# Patient Record
Sex: Male | Born: 1985 | Race: White | Hispanic: No | Marital: Single | Smoking: Current every day smoker
Health system: Southern US, Community
[De-identification: ages and names within clinical notes are randomized; demographics above are authoritative.]

## PROBLEM LIST (undated history)

## (undated) DIAGNOSIS — I10 Essential (primary) hypertension: Secondary | ICD-10-CM

---
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
## 2011-03-10 ENCOUNTER — Encounter (HOSPITAL_COMMUNITY): Payer: Self-pay | Admitting: Emergency Medicine

## 2011-03-10 ENCOUNTER — Emergency Department (HOSPITAL_COMMUNITY)
Admission: EM | Admit: 2011-03-10 | Discharge: 2011-03-10 | Disposition: A | Discharge status: Home / Self Care | Payer: BC Managed Care – PPO | Attending: Emergency Medicine | Admitting: Emergency Medicine

## 2011-03-10 DIAGNOSIS — K0889 Other specified disorders of teeth and supporting structures: Secondary | ICD-10-CM

## 2011-03-10 DIAGNOSIS — K089 Disorder of teeth and supporting structures, unspecified: Secondary | ICD-10-CM | POA: Insufficient documentation

## 2011-03-10 MED ORDER — PENICILLIN V POTASSIUM 500 MG PO TABS: 500.0000 mg | ORAL_TABLET | Freq: Four times a day (QID) | ORAL | Status: AC

## 2011-03-10 MED ORDER — OXYCODONE-ACETAMINOPHEN 5-325 MG PO TABS: 1.0000 | ORAL_TABLET | Freq: Four times a day (QID) | ORAL | Status: AC | PRN

## 2011-03-10 NOTE — Discharge Instructions (Signed)
Dental Pain  A tooth ache may be caused by cavities (tooth decay). Cavities expose the nerve of the tooth to air and hot or cold temperatures. It may come from an infection or abscess (also called a boil or furuncle) around your tooth. It is also often caused by dental caries (tooth decay). This causes the pain you are having.  DIAGNOSIS   Your caregiver can diagnose this problem by exam.  TREATMENT   · If caused by an infection, it may be treated with medications which kill germs (antibiotics) and pain medications as prescribed by your caregiver. Take medications as directed.  · Only take over-the-counter or prescription medicines for pain, discomfort, or fever as directed by your caregiver.  · Whether the tooth ache today is caused by infection or dental disease, you should see your dentist as soon as possible for further care.  SEEK MEDICAL CARE IF:  The exam and treatment you received today has been provided on an emergency basis only. This is not a substitute for complete medical or dental care. If your problem worsens or new problems (symptoms) appear, and you are unable to meet with your dentist, call or return to this location.  SEEK IMMEDIATE MEDICAL CARE IF:   · You have a fever.  · You develop redness and swelling of your face, jaw, or neck.  · You are unable to open your mouth.  · You have severe pain uncontrolled by pain medicine.  MAKE SURE YOU:   · Understand these instructions.  · Will watch your condition.  · Will get help right away if you are not doing well or get worse.  Document Released: 12/20/2004 Document Revised: 12/09/2010 Document Reviewed: 08/08/2007  [employer]® Patient Information ©2012 [employer].

## 2011-03-10 NOTE — ED Notes (Signed)
PT. REPORTS RIGHT AND LEFT UPPER MOLAR PAIN FOR 2 WEEKS.

## 2011-03-10 NOTE — ED Provider Notes (Signed)
History     CSN: [id number]  Arrival date & time 03/10/11  2694   First MD Initiated Contact with Patient 03/10/11 [phone]      Chief Complaint  Patient presents with  . Dental Pain    (Consider location/radiation/quality/duration/timing/severity/associated sxs/prior treatment) Patient is a 26 y.o. male presenting with tooth pain. The history is provided by the patient.  Dental PainThe primary symptoms include mouth pain. Primary symptoms do not include sore throat.  Additional symptoms do not include: trouble swallowing and drooling.   patient has had dental pain for last 2 weeks. He states he has 2 wisdom teeth on the top that are impacted. He has a Education officer, community but has not been able to get in to get them removed yet. They appear to be broken off. No new trauma. No fevers. The patient is worse with eating.  History reviewed. No pertinent past medical history.  History reviewed. No pertinent past surgical history.  No family history on file.  History  Substance Use Topics  . Smoking status: Never Smoker   . Smokeless tobacco: Not on file  . Alcohol Use: No      Review of Systems  HENT: Positive for dental problem. Negative for sore throat, drooling, mouth sores, trouble swallowing and voice change.   Eyes: Negative for pain.    Allergies  Review of patient's allergies indicates not on file.  Home Medications   Current Outpatient Rx  Name Route Sig Dispense Refill  . OXYCODONE-ACETAMINOPHEN 5-325 MG PO TABS Oral Take 1-2 tablets by mouth every 6 (six) hours as needed for pain. 20 tablet 0  . PENICILLIN V POTASSIUM 500 MG PO TABS Oral Take 1 tablet (500 mg total) by mouth 4 (four) times daily. 40 tablet 0    BP 134/77  Pulse 71  Temp(Src) 97.9 F (36.6 C) (Oral)  Resp 20  SpO2 99%  Physical Exam  Constitutional: He appears well-developed.  HENT:  Head: Normocephalic.       Bilateral upper wisdom teeth tender. Appear be broken off at the gum.. no fluctuance of gums      ED Course  Procedures (including critical care time)  Labs Reviewed - No data to display No results found.   1. Pain, dental       MDM  Dental pain. We'll give antibiotics of penicillin. Visual follow with his dentist or the dentist on call.   Juliet Rude. Rubin Payor, MD 03/10/11 [phone]

## 2011-03-14 ENCOUNTER — Encounter (HOSPITAL_COMMUNITY): Payer: Self-pay | Admitting: Emergency Medicine

## 2011-09-29 ENCOUNTER — Emergency Department (HOSPITAL_COMMUNITY)
Admission: EM | Admit: 2011-09-29 | Discharge: 2011-09-29 | Disposition: A | Discharge status: Home / Self Care | Payer: BC Managed Care – PPO | Attending: Emergency Medicine | Admitting: Emergency Medicine

## 2011-09-29 DIAGNOSIS — J069 Acute upper respiratory infection, unspecified: Secondary | ICD-10-CM | POA: Insufficient documentation

## 2011-09-29 DIAGNOSIS — K0889 Other specified disorders of teeth and supporting structures: Secondary | ICD-10-CM

## 2011-09-29 DIAGNOSIS — K0510 Chronic gingivitis, plaque induced: Secondary | ICD-10-CM | POA: Insufficient documentation

## 2011-09-29 MED ORDER — PENICILLIN V POTASSIUM 500 MG PO TABS: 500.0000 mg | ORAL_TABLET | Freq: Four times a day (QID) | ORAL | Status: DC

## 2011-09-29 MED ORDER — OXYCODONE-ACETAMINOPHEN 5-325 MG PO TABS: 2.0000 | ORAL_TABLET | ORAL | Status: DC | PRN

## 2011-09-29 MED ORDER — PENICILLIN V POTASSIUM 500 MG PO TABS: 500.0000 mg | ORAL_TABLET | Freq: Four times a day (QID) | ORAL | Status: AC

## 2011-09-29 NOTE — ED Notes (Signed)
States dental/tooth pain started about 5 days, back right wisdom tooth, also body aches and cough started about 3 days, vomited once ysterday and once today, productive cough, green sputum

## 2011-09-29 NOTE — ED Provider Notes (Signed)
History     CSN: [id number]  Arrival date & time 09/29/11  1010   First MD Initiated Contact with Patient 09/29/11 1042      Chief Complaint  Patient presents with  . Dental Pain  . Cough  . Generalized Body Aches    (Consider location/radiation/quality/duration/timing/severity/associated sxs/prior treatment) Patient is a 26 y.o. male presenting with tooth pain and cough. The history is provided by the patient. No language interpreter was used.  Dental PainThe primary symptoms include cough. Primary symptoms do not include mouth pain (right upper wisdom tooth), dental injury, oral bleeding, oral lesions, headaches, fever, shortness of breath, sore throat or angioedema. The symptoms began 3 to 5 days ago. The symptoms are worsening. The symptoms are recurrent. The symptoms occur constantly.  The cough began 3 to 5 days ago. The cough is new. The cough is productive and vomit inducing. The sputum is green and clear.  Additional symptoms include: dental sensitivity to temperature, gum swelling, gum tenderness and jaw pain. Additional symptoms do not include: purulent gums, trismus, trouble swallowing, pain with swallowing, excessive salivation, dry mouth, smell disturbance, drooling, ear pain, hearing loss, nosebleeds, swollen glands, goiter and fatigue. Medical issues include: smoking. Medical issues do not include: alcohol problem, chewing tobacco, immunosuppression, periodontal disease and cancer.   Cough This is a new problem. The current episode started more than 2 days ago. The problem occurs every few minutes. The problem has not changed since onset.The cough is productive of sputum. There has been no fever. Associated symptoms include chills, sweats and rhinorrhea. Pertinent negatives include no chest pain, no weight loss, no ear congestion, no ear pain, no headaches, no sore throat, no myalgias, no shortness of breath, no wheezing and no eye redness. He has tried cough syrup for the  symptoms. The treatment provided mild relief. He is a smoker. His past medical history does not include bronchitis, pneumonia, bronchiectasis, emphysema or asthma.    No past medical history on file.  No past surgical history on file.  No family history on file.  History  Substance Use Topics  . Smoking status: Never Smoker   . Smokeless tobacco: Not on file  . Alcohol Use: No      Review of Systems  Constitutional: Positive for chills. Negative for fever, weight loss and fatigue.  HENT: Positive for rhinorrhea. Negative for hearing loss, ear pain, nosebleeds, sore throat, drooling and trouble swallowing.   Eyes: Negative for redness.  Respiratory: Positive for cough. Negative for shortness of breath and wheezing.   Cardiovascular: Negative for chest pain.  Musculoskeletal: Negative for myalgias.  Neurological: Negative for headaches.    Allergies  Review of patient's allergies indicates no known allergies.  Home Medications   Current Outpatient Rx  Name Route Sig Dispense Refill  . NYQUIL PO Oral Take 15 mLs by mouth at bedtime as needed.    . OXYCODONE-ACETAMINOPHEN 5-325 MG PO TABS Oral Take 2 tablets by mouth every 4 (four) hours as needed for pain. 10 tablet 0  . PENICILLIN V POTASSIUM 500 MG PO TABS Oral Take 1 tablet (500 mg total) by mouth 4 (four) times daily. 40 tablet 0    BP 125/90  Pulse 86  Temp 98.6 F (37 C)  Resp 16  SpO2 99%  Physical Exam  Nursing note and vitals reviewed. Constitutional: He is oriented to person, place, and time. He appears well-developed and well-nourished. No distress.  HENT:  Head: Normocephalic and atraumatic.  Mouth/Throat:  Eyes: Conjunctivae normal are normal. No scleral icterus.  Neck: Normal range of motion. Neck supple.  Cardiovascular: Normal rate, regular rhythm and normal heart sounds.   Pulmonary/Chest: Effort normal and breath sounds normal. No accessory muscle usage. No respiratory distress. He has no  wheezes.  Abdominal: Soft. There is no tenderness.  Musculoskeletal: He exhibits no edema.  Lymphadenopathy:    He has no cervical adenopathy.  Neurological: He is alert and oriented to person, place, and time.  Skin: Skin is warm and dry. He is not diaphoretic.  Psychiatric: His behavior is normal.    ED Course  Procedures (including critical care time)  Labs Reviewed - No data to display No results found.   1. Pain, dental   2. Gingivitis   3. URI (upper respiratory infection)       MDM  Patient without signs of ludwigs angina.  No signs of periapical abscess or areas of fluctuance.  Patient has established dental appointment with his provider next week. Lungs CTAB, patient likely has viral URI.  He may treat symptomatically.  D/C patient with pain control and PEn VK for dental pain and gum swelling. Discussed reasons to seek immediate care. Patient expresses understanding and agrees with plan.          Arthor Captain, PA-C 09/29/11 2042

## 2011-09-30 NOTE — ED Provider Notes (Signed)
Medical screening examination/treatment/procedure(s) were performed by non-physician practitioner and as supervising physician I was immediately available for consultation/collaboration.  Yordan Martindale, MD 09/30/11 0707 

## 2015-01-04 DIAGNOSIS — F909 Attention-deficit hyperactivity disorder, unspecified type: Secondary | ICD-10-CM

## 2015-01-04 HISTORY — DX: Attention-deficit hyperactivity disorder, unspecified type: F90.9

## 2015-10-07 DIAGNOSIS — M2550 Pain in unspecified joint: Secondary | ICD-10-CM | POA: Insufficient documentation

## 2015-10-07 DIAGNOSIS — G5603 Carpal tunnel syndrome, bilateral upper limbs: Secondary | ICD-10-CM | POA: Insufficient documentation

## 2015-10-07 DIAGNOSIS — F988 Other specified behavioral and emotional disorders with onset usually occurring in childhood and adolescence: Secondary | ICD-10-CM | POA: Insufficient documentation

## 2020-01-02 NOTE — Progress Notes (Deleted)
   Office Visit Note  Patient: Brad Gutierrez             Date of Birth: 09-17-1985           MRN: [id number]             PCP: Enid Skeens., MD Referring: Enid Skeens., MD Visit Date: 01/15/2020 Occupation: @GUAROCC @  Subjective:  No chief complaint on file.   History of Present Illness: [name] is a 34 y.o. male ***   Activities of Daily Living:  Patient reports morning stiffness for *** {minute/hour:19697}.   Patient {ACTIONS;DENIES/REPORTS:21021675::"Denies"} nocturnal pain.  Difficulty dressing/grooming: {ACTIONS;DENIES/REPORTS:21021675::"Denies"} Difficulty climbing stairs: {ACTIONS;DENIES/REPORTS:21021675::"Denies"} Difficulty getting out of chair: {ACTIONS;DENIES/REPORTS:21021675::"Denies"} Difficulty using hands for taps, buttons, cutlery, and/or writing: {ACTIONS;DENIES/REPORTS:21021675::"Denies"}  No Rheumatology ROS completed.   PMFS History:  There are no problems to display for this patient.   No past medical history on file.  No family history on file. No past surgical history on file. Social History   Social History Narrative  . Not on file    There is no immunization history on file for this patient.   Objective: Vital Signs: There were no vitals taken for this visit.   Physical Exam   Musculoskeletal Exam: ***  CDAI Exam: CDAI Score: -- Patient Global: --; Provider Global: -- Swollen: --; Tender: -- Joint Exam 01/15/2020   No joint exam has been documented for this visit   There is currently no information documented on the homunculus. Go to the Rheumatology activity and complete the homunculus joint exam.  Investigation: No additional findings.  Imaging: No results found.  Recent Labs: No results found for: WBC, HGB, PLT, NA, K, CL, CO2, GLUCOSE, BUN, CREATININE, BILITOT, ALKPHOS, AST, ALT, PROT, ALBUMIN, CALCIUM, GFRAA, QFTBGOLD, QFTBGOLDPLUS  Speciality Comments: No specialty comments available.  Procedures:  No  procedures performed Allergies: Patient has no known allergies.   Assessment / Plan:     Visit Diagnoses: Pain in both hands  Positive ANA (antinuclear antibody) - ANA 1:80 Speckled, 1:80 homogeneous, ESR 0, uric acid 4.4, RF<10  Adult ADHD  Anxiety and depression  History of gastroesophageal reflux (GERD)  Orders: No orders of the defined types were placed in this encounter.  No orders of the defined types were placed in this encounter.   Face-to-face time spent with patient was *** minutes. Greater than 50% of time was spent in counseling and coordination of care.  Follow-Up Instructions: No follow-ups on file.   Ofilia Neas, PA-C  Note - This record has been created using Dragon software.  Chart creation errors have been sought, but may not always  have been located. Such creation errors do not reflect on  the standard of medical care.

## 2020-01-15 ENCOUNTER — Ambulatory Visit: Payer: Self-pay | Admitting: Rheumatology

## 2020-02-05 ENCOUNTER — Ambulatory Visit: Payer: Self-pay | Admitting: Rheumatology

## 2020-07-07 ENCOUNTER — Other Ambulatory Visit: Payer: Self-pay

## 2020-07-07 ENCOUNTER — Emergency Department (HOSPITAL_COMMUNITY)
Admission: EM | Admit: 2020-07-07 | Discharge: 2020-07-07 | Disposition: A | Discharge status: Home / Self Care | Payer: Medicaid Other | Attending: Emergency Medicine | Admitting: Emergency Medicine

## 2020-07-07 ENCOUNTER — Encounter (HOSPITAL_COMMUNITY): Payer: Self-pay | Admitting: Emergency Medicine

## 2020-07-07 ENCOUNTER — Emergency Department (HOSPITAL_COMMUNITY): Payer: Medicaid Other

## 2020-07-07 DIAGNOSIS — K149 Disease of tongue, unspecified: Secondary | ICD-10-CM | POA: Diagnosis not present

## 2020-07-07 DIAGNOSIS — Y92410 Unspecified street and highway as the place of occurrence of the external cause: Secondary | ICD-10-CM | POA: Insufficient documentation

## 2020-07-07 DIAGNOSIS — R079 Chest pain, unspecified: Secondary | ICD-10-CM | POA: Insufficient documentation

## 2020-07-07 DIAGNOSIS — R519 Headache, unspecified: Secondary | ICD-10-CM | POA: Diagnosis not present

## 2020-07-07 DIAGNOSIS — Z5321 Procedure and treatment not carried out due to patient leaving prior to being seen by health care provider: Secondary | ICD-10-CM | POA: Insufficient documentation

## 2020-07-07 HISTORY — DX: Essential (primary) hypertension: I10

## 2020-07-07 NOTE — ED Notes (Signed)
Pt called for vitals, no response. 
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
## 2022-11-04 HISTORY — PX: KNEE ARTHROSCOPY W/ MENISCAL REPAIR: SHX1877

## 2023-06-22 ENCOUNTER — Ambulatory Visit

## 2023-06-22 VITALS — BP 146/110 | HR 113 | Temp 97.4°F | Resp 14 | Ht 71.0 in | Wt 202.0 lb

## 2023-06-22 DIAGNOSIS — F900 Attention-deficit hyperactivity disorder, predominantly inattentive type: Secondary | ICD-10-CM | POA: Diagnosis not present

## 2023-06-22 DIAGNOSIS — K219 Gastro-esophageal reflux disease without esophagitis: Secondary | ICD-10-CM | POA: Diagnosis not present

## 2023-06-22 DIAGNOSIS — G5603 Carpal tunnel syndrome, bilateral upper limbs: Secondary | ICD-10-CM | POA: Diagnosis not present

## 2023-06-22 DIAGNOSIS — Z13220 Encounter for screening for lipoid disorders: Secondary | ICD-10-CM | POA: Insufficient documentation

## 2023-06-22 DIAGNOSIS — I10 Essential (primary) hypertension: Secondary | ICD-10-CM | POA: Diagnosis not present

## 2023-06-22 DIAGNOSIS — F17200 Nicotine dependence, unspecified, uncomplicated: Secondary | ICD-10-CM | POA: Insufficient documentation

## 2023-06-22 MED ORDER — OMEPRAZOLE 40 MG PO CPDR: 40.0000 mg | DELAYED_RELEASE_CAPSULE | Freq: Two times a day (BID) | ORAL | 1 refills | Status: AC

## 2023-06-22 MED ORDER — AMLODIPINE BESYLATE 10 MG PO TABS: 10.0000 mg | ORAL_TABLET | Freq: Every day | ORAL | 1 refills | Status: AC

## 2023-06-22 NOTE — Assessment & Plan Note (Signed)
 Chronic heartburn managed with over-the-counter omeprazole. Symptoms suggestive of GERD, exacerbated by high sodium and spicy foods. - Prescribe omeprazole 40 mg, with instructions to take once daily, but prescription allows for twice daily if needed

## 2023-06-22 NOTE — Assessment & Plan Note (Signed)
   General Health Maintenance Emphasized lifestyle modifications, including smoking cessation, dietary changes, and regular exercise, to reduce cardiac risk and improve blood pressure control. - Encourage smoking cessation - Advise reducing sodium intake - Recommend regular exercise for 30-40 minutes daily  Follow-up Plan to assess blood pressure control and overall health progress. - Schedule follow-up appointment in a couple of months

## 2023-06-22 NOTE — Assessment & Plan Note (Signed)
 Bilateral carpal tunnel syndrome with significant morning stiffness and difficulty with hand function. No intervention at this time. Could use brace, exercises,

## 2023-06-22 NOTE — Assessment & Plan Note (Addendum)
 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 Previously on Adderall, but not currently due to hypertension concerns. Strattera was ineffective. Focus on controlling blood pressure before considering ADHD medication options. - Focus on controlling blood pressure before considering ADHD medication options - explained to him that though Adderall worked the best for him, I am very hesitant to start him on a stimulant with his elevated blood pressures. He does understand it.   If his BP stabilizes with the medicine, could consider something like concerta.

## 2023-06-22 NOTE — Progress Notes (Signed)
 New Patient Office Visit  Subjective    Patient ID: Brad Gutierrez, male    DOB: February 22, 1985  Age: 38 y.o. MRN: [id number]  CC:  Chief Complaint  Patient presents with   New Patient (Initial Visit)   Hypertension   ADHD   Chest Pain    HPI Brad Gutierrez presents to establish care Discussed the use of AI scribe software for clinical note transcription with the patient, who gave verbal consent to proceed.  History of Present Illness   Brad Gutierrez is a 38 year old male is here to [employer]. he is with hypertension who presents for management of high blood pressure.  He has a history of hypertension and previously found amlodipine 10 mg effective, but he has not been on the medication due to loss of insurance. He monitors his blood pressure at home, with readings typically around 150-160/115 mmHg.  He experiences frequent sharp chest pains, sometimes occurring at rest. A significant episode last year led him to the hospital, where an EKG and blood work, including troponins, were normal. He also experiences heartburn and takes over-the-counter omeprazole 40 mg once daily.  He has a history of left knee surgery in November 2024 for meniscus and ligament repair. He occasionally experiences sharp pains in the knee, for which he takes ibuprofen 800 mg as needed.  He reports bilateral carpal tunnel syndrome, causing significant pain and difficulty in hand function, particularly in the mornings.  He has a history of ADHD, previously managed with Adderall, which he has not taken recently due to lack of insurance. 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  He smokes a pack of cigarettes daily since around age [age]. He has attempted to quit in the past but is not currently interested in quitting. He occasionally uses marijuana and does not consume alcohol.       Outpatient Encounter Medications as of 06/22/2023  Medication  Sig   ibuprofen (ADVIL) 800 MG tablet Take 800 mg by mouth.   omeprazole (PRILOSEC) 40 MG capsule Take 1 capsule (40 mg total) by mouth 2 (two) times daily.   [DISCONTINUED] amLODipine (NORVASC) 10 MG tablet Take 10 mg by mouth daily.   [DISCONTINUED] atomoxetine (STRATTERA) 40 MG capsule 1 capsule in the morning Orally Once a day-may increase to two tabs once daily after 3 days for 30 days   amLODipine (NORVASC) 10 MG tablet Take 1 tablet (10 mg total) by mouth daily.   amphetamine-dextroamphetamine (ADDERALL XR) 20 MG 24 hr capsule Take 20 mg by mouth. (Patient not taking: Reported on 06/22/2023)   [DISCONTINUED] oxyCODONE -acetaminophen  (PERCOCET) 5-325 MG per tablet Take 2 tablets by mouth every 4 (four) hours as needed for pain.   [DISCONTINUED] Pseudoeph-Doxylamine-DM-APAP (NYQUIL PO) Take 15 mLs by mouth at bedtime as needed.   No facility-administered encounter medications on file as of 06/22/2023.    Past Medical History:  Diagnosis Date   ADHD (attention deficit hyperactivity disorder) 2017   Hypertension     Past Surgical History:  Procedure Laterality Date   KNEE ARTHROSCOPY W/ MENISCAL REPAIR Left 11/2022   TYMPANOSTOMY TUBE PLACEMENT  1994    Family History  Problem Relation Age of Onset   Bipolar disorder Mother    Anxiety disorder Mother    Hypertension Father    Anxiety disorder Half-Sister    Bipolar disorder Half-Sister     Social History   Socioeconomic History   Marital status: Single  Spouse name: Not on file   Number of children: Not on file   Years of education: Not on file   Highest education level: Not on file  Occupational History   Not on file  Tobacco Use   Smoking status: Every Day    Current packs/day: 1.00    Average packs/day: 1 pack/day for 22.5 years (22.5 ttl pk-yrs)    Types: Cigarettes    Start date: 2003   Smokeless tobacco: Never  Substance and Sexual Activity   Alcohol use: No   Drug use: Yes    Frequency: 1.0 times per  week    Types: Marijuana    Comment: ocassionally   Sexual activity: Yes    Partners: Female    Birth control/protection: None  Other Topics Concern   Not on file  Social History Narrative   Not on file   Social Drivers of Health   Financial Resource Strain: Not on file  Food Insecurity: Not on file  Transportation Needs: Not on file  Physical Activity: Not on file  Stress: Not on file  Social Connections: Not on file  Intimate Partner Violence: Not At Risk (10/25/2022)   Received from [facility]   HITS    Over the last 12 months how often did your partner physically hurt you?: Never    Over the last 12 months how often did your partner insult you or talk down to you?: Never    Over the last 12 months how often did your partner threaten you with physical harm?: Never    Over the last 12 months how often did your partner scream or curse at you?: Never    Review of Systems  Constitutional:  Negative for chills, fever and malaise/fatigue.  HENT:  Negative for congestion, ear pain and sinus pain.   Eyes:  Negative for pain.  Respiratory:  Negative for cough and shortness of breath.   Cardiovascular:  Positive for chest pain (NOT CURRENTLY).  Gastrointestinal:  Negative for constipation, diarrhea, nausea and vomiting.  Genitourinary:  Negative for dysuria and urgency.  Psychiatric/Behavioral:  The patient is nervous/anxious.        Attention deficit        Objective    BP (!) 146/110 (BP Location: Left Arm, Patient Position: Sitting)   Pulse (!) 113   Temp (!) 97.4 F (36.3 C)   Resp 14   Ht 5' 11 (1.803 m)   Wt 202 lb (91.6 kg)   SpO2 98%   BMI 28.17 kg/m   Physical Exam Vitals and nursing note reviewed.  Constitutional:      Appearance: He is well-developed.  HENT:     Head: Normocephalic and atraumatic.   Cardiovascular:     Rate and Rhythm: Normal rate and regular rhythm.  Pulmonary:     Effort: Pulmonary effort is normal.     Breath sounds:  Normal breath sounds.  Abdominal:     Palpations: Abdomen is soft.     Tenderness: There is no abdominal tenderness.   Musculoskeletal:        General: Normal range of motion.     Cervical back: Normal range of motion.   Skin:    General: Skin is warm.   Neurological:     General: No focal deficit present.     Mental Status: He is alert.   Psychiatric:        Mood and Affect: Mood normal.         Assessment &  Plan:   Problem List Items Addressed This Visit     Bilateral carpal tunnel syndrome - Primary   Bilateral carpal tunnel syndrome with significant morning stiffness and difficulty with hand function. No intervention at this time. Could use brace, exercises,      Relevant Medications   amphetamine-dextroamphetamine (ADDERALL XR) 20 MG 24 hr capsule   ADD (attention deficit disorder)   ADHD with inattentiveness affecting work performance. Previously on Adderall, but not currently due to hypertension concerns. Strattera was ineffective. Focus on controlling blood pressure before considering ADHD medication options. - Focus on controlling blood pressure before considering ADHD medication options - explained to him that though Adderall worked the best for him, I am very hesitant to start him on a stimulant with his elevated blood pressures. He does understand it.   If his BP stabilizes with the medicine, could consider something like concerta.      Benign essential HTN   Uncontrolled hypertension with readings around 150-160/115. Previously managed with amlodipine 10 mg, which was effective. High sodium intake and smoking are contributing factors, increasing cardiac risk. - Prescribe amlodipine 10 mg daily - Advise reducing sodium intake - Encourage smoking cessation - Order blood work to assess blood sugar and cholesterol levels - Recheck blood pressure before leaving the clinic- which was still elevated.        Relevant Medications   amLODipine (NORVASC) 10 MG  tablet   Other Relevant Orders   Comprehensive metabolic panel with GFR   CBC with Differential/Platelet   Gastroesophageal reflux disease   Chronic heartburn managed with over-the-counter omeprazole. Symptoms suggestive of GERD, exacerbated by high sodium and spicy foods. - Prescribe omeprazole 40 mg, with instructions to take once daily, but prescription allows for twice daily if needed      Relevant Medications   omeprazole (PRILOSEC) 40 MG capsule   Other Relevant Orders   CBC with Differential/Platelet   Tobacco use disorder   Strongly recommended to cut back on the number of cigarettes and try to quit ultimately. He is not ready to quit as of today but will continue to bring this up and discuss at future visits      Screening for lipid disorders     General Health Maintenance Emphasized lifestyle modifications, including smoking cessation, dietary changes, and regular exercise, to reduce cardiac risk and improve blood pressure control. - Encourage smoking cessation - Advise reducing sodium intake - Recommend regular exercise for 30-40 minutes daily  Follow-up Plan to assess blood pressure control and overall health progress. - Schedule follow-up appointment in a couple of months       Relevant Orders   Lipid panel  Assessment and Plan            Return in about 8 weeks (around 08/17/2023) for chronic disease follow up.   Bricyn Labrada, MD

## 2023-06-22 NOTE — Assessment & Plan Note (Signed)
 Uncontrolled hypertension with readings around 150-160/115. Previously managed with amlodipine 10 mg, which was effective. High sodium intake and smoking are contributing factors, increasing cardiac risk. - Prescribe amlodipine 10 mg daily - Advise reducing sodium intake - Encourage smoking cessation - Order blood work to assess blood sugar and cholesterol levels - Recheck blood pressure before leaving the clinic- which was still elevated.

## 2023-06-22 NOTE — Assessment & Plan Note (Signed)
 Strongly recommended to cut back on the number of cigarettes and try to quit ultimately. He is not ready to quit as of today but will continue to bring this up and discuss at future visits

## 2023-06-23 ENCOUNTER — Ambulatory Visit: Payer: Self-pay

## 2023-06-23 LAB — COMPREHENSIVE METABOLIC PANEL WITH GFR
ALT: 55 IU/L — ABNORMAL HIGH (ref 0–44)
AST: 23 IU/L (ref 0–40)
Albumin: 4.6 g/dL (ref 4.1–5.1)
Alkaline Phosphatase: 104 IU/L (ref 44–121)
BUN/Creatinine Ratio: 11 (ref 9–20)
BUN: 14 mg/dL (ref 6–20)
Bilirubin Total: 0.4 mg/dL (ref 0.0–1.2)
CO2: 21 mmol/L (ref 20–29)
Calcium: 9.4 mg/dL (ref 8.7–10.2)
Chloride: 104 mmol/L (ref 96–106)
Creatinine, Ser: 1.24 mg/dL (ref 0.76–1.27)
Globulin, Total: 2.2 g/dL (ref 1.5–4.5)
Glucose: 95 mg/dL (ref 70–99)
Potassium: 4.3 mmol/L (ref 3.5–5.2)
Sodium: 140 mmol/L (ref 134–144)
Total Protein: 6.8 g/dL (ref 6.0–8.5)
eGFR: 76 mL/min/{1.73_m2} (ref >59)

## 2023-06-23 LAB — CBC WITH DIFFERENTIAL/PLATELET
Basophils Absolute: 0.1 10*3/uL (ref 0.0–0.2)
Basos: 1 %
EOS (ABSOLUTE): 0.5 10*3/uL — ABNORMAL HIGH (ref 0.0–0.4)
Eos: 5 %
Hematocrit: 54.5 % — ABNORMAL HIGH (ref 37.5–51.0)
Hemoglobin: 18.5 g/dL — ABNORMAL HIGH (ref 13.0–17.7)
Immature Grans (Abs): 0.1 10*3/uL (ref 0.0–0.1)
Immature Granulocytes: 1 %
Lymphocytes Absolute: 3.2 10*3/uL — ABNORMAL HIGH (ref 0.7–3.1)
Lymphs: 32 %
MCH: 29.7 pg (ref 26.6–33.0)
MCHC: 33.9 g/dL (ref 31.5–35.7)
MCV: 88 fL (ref 79–97)
Monocytes Absolute: 0.9 10*3/uL (ref 0.1–0.9)
Monocytes: 9 %
Neutrophils Absolute: 5.2 10*3/uL (ref 1.4–7.0)
Neutrophils: 52 %
Platelets: 352 10*3/uL (ref 150–450)
RBC: 6.23 x10E6/uL — ABNORMAL HIGH (ref 4.14–5.80)
RDW: 13.8 % (ref 11.6–15.4)
WBC: 9.9 10*3/uL (ref 3.4–10.8)

## 2023-06-23 LAB — LIPID PANEL
Chol/HDL Ratio: 4.5 ratio (ref 0.0–5.0)
Cholesterol, Total: 191 mg/dL (ref 100–199)
HDL: 42 mg/dL (ref >39)
LDL Chol Calc (NIH): 113 mg/dL — ABNORMAL HIGH (ref 0–99)
Triglycerides: 205 mg/dL — ABNORMAL HIGH (ref 0–149)
VLDL Cholesterol Cal: 36 mg/dL (ref 5–40)

## 2023-07-03 ENCOUNTER — Ambulatory Visit: Payer: Self-pay

## 2023-07-03 NOTE — Telephone Encounter (Signed)
 FYI Only or Action Required?: FYI only for provider.  Patient was last seen in primary care on 06/22/2023 by Sirivol, Mamatha, MD. Called Nurse Triage reporting Dental Pain. Symptoms began several days ago. Interventions attempted: OTC medications: Ibuprofen and Ice/heat application. Symptoms are: unchanged.  Triage Disposition: See Physician Within 24 Hours (overriding See Dentist Within 24 Hours)  Patient/caregiver understands and will follow disposition?: Yes  Appt scheduled tomorrow 1040 with PCP.  Copied from CRM [id number]. Topic: Clinical - Red Word Triage >> Jul 03, 2023  4:29 PM Marissa P wrote: Red Word that prompted transfer to Nurse Triage: Face swollen from a tooth ache, dentist wont see or do anything while its infected. Reason for Disposition  [1] Face is swollen AND [2] no fever  Answer Assessment - Initial Assessment Questions 1. LOCATION: Which tooth is hurting?  (e.g., right-side/left-side, upper/lower, front/back)     Top L side of tooth  2. ONSET: When did the toothache start?  (e.g., hours, days)      2 days  3. SEVERITY: How bad is the toothache?  (Scale 1-10; mild, moderate or severe)   - MILD (1-3): doesn't interfere with chewing    - MODERATE (4-7): interferes with chewing, interferes with normal activities, awakens from sleep     - SEVERE (8-10): unable to eat, unable to do any normal activities, excruciating pain        8/10 4. SWELLING: Is there any visible swelling of your face?     L side of face  5. OTHER SYMPTOMS: Do you have any other symptoms? (e.g., fever)  Protocols used: Toothache-A-AH

## 2023-07-04 ENCOUNTER — Ambulatory Visit

## 2023-08-22 ENCOUNTER — Ambulatory Visit

## 2023-08-23 ENCOUNTER — Ambulatory Visit (INDEPENDENT_AMBULATORY_CARE_PROVIDER_SITE_OTHER)

## 2023-08-23 VITALS — BP 148/88 | HR 68 | Temp 97.6°F | Ht 71.0 in | Wt 199.0 lb

## 2023-08-23 DIAGNOSIS — B079 Viral wart, unspecified: Secondary | ICD-10-CM | POA: Insufficient documentation

## 2023-08-23 DIAGNOSIS — I10 Essential (primary) hypertension: Secondary | ICD-10-CM

## 2023-08-23 DIAGNOSIS — G5603 Carpal tunnel syndrome, bilateral upper limbs: Secondary | ICD-10-CM

## 2023-08-23 DIAGNOSIS — F900 Attention-deficit hyperactivity disorder, predominantly inattentive type: Secondary | ICD-10-CM | POA: Diagnosis not present

## 2023-08-23 MED ORDER — LOSARTAN POTASSIUM 25 MG PO TABS: 25.0000 mg | ORAL_TABLET | Freq: Every day | ORAL | 0 refills | Status: AC

## 2023-08-23 NOTE — Progress Notes (Signed)
 Subjective:  Patient ID: Brad Gutierrez, male    DOB: March 25, 1985  Age: 38 y.o. MRN: [id number]  Chief Complaint  Patient presents with   Medical Management of Chronic Issues    HPI: Brad Gutierrez is here for  a follow up appt. I have seen him in June. I had started him on Amlodipine  10 mg which he previously took at his visit, to help with his BP readings.  States he has been checking his home BP every day or every other day and noted that they were high on the lower number, in the 90s.  Wants to go on Adderall for his ADD.  Smoking a pack a day.  Reports worsening hand pain from carpal tunnel.     08/23/2023   10:36 AM 06/22/2023    2:56 PM  Depression screen PHQ 2/9  Decreased Interest 0 0  Down, Depressed, Hopeless 0 0  PHQ - 2 Score 0 0        08/23/2023   10:36 AM  Fall Risk   Falls in the past year? 0  Number falls in past yr: 0  Injury with Fall? 0  Risk for fall due to : No Fall Risks  Follow up Falls evaluation completed    Patient Care Team: Viviana Trimble, MD as PCP - General (Family Medicine)   Review of Systems  Constitutional:  Negative for chills, fatigue and fever.  HENT:  Negative for congestion, ear pain, sinus pressure and sore throat.   Respiratory:  Negative for cough and shortness of breath.   Cardiovascular:  Negative for chest pain.  Gastrointestinal:  Negative for abdominal pain, constipation, diarrhea, nausea and vomiting.  Genitourinary:  Negative for dysuria and frequency.  Musculoskeletal:  Negative for arthralgias, back pain and myalgias.  Neurological:  Positive for numbness (bilateral hand pain and numbness). Negative for dizziness and headaches.  Psychiatric/Behavioral:  Positive for decreased concentration. Negative for dysphoric mood. The patient is not nervous/anxious.     Current Outpatient Medications on File Prior to Visit  Medication Sig Dispense Refill   amLODipine  (NORVASC ) 10 MG tablet Take 1 tablet (10 mg total) by mouth  daily. 90 tablet 1   omeprazole  (PRILOSEC) 40 MG capsule Take 1 capsule (40 mg total) by mouth 2 (two) times daily. 180 capsule 1   amphetamine-dextroamphetamine (ADDERALL XR) 20 MG 24 hr capsule Take 20 mg by mouth. (Patient not taking: Reported on 08/23/2023)     No current facility-administered medications on file prior to visit.   Past Medical History:  Diagnosis Date   ADHD (attention deficit hyperactivity disorder) 2017   Hypertension    Past Surgical History:  Procedure Laterality Date   KNEE ARTHROSCOPY W/ MENISCAL REPAIR Left 11/2022   TYMPANOSTOMY TUBE PLACEMENT  1994    Family History  Problem Relation Age of Onset   Bipolar disorder Mother    Anxiety disorder Mother    Hypertension Father    Anxiety disorder Half-Sister    Bipolar disorder Half-Sister    Social History   Socioeconomic History   Marital status: Single    Spouse name: Not on file   Number of children: Not on file   Years of education: Not on file   Highest education level: Not on file  Occupational History   Not on file  Tobacco Use   Smoking status: Every Day    Current packs/day: 1.00    Average packs/day: 1 pack/day for 22.6 years (22.6 ttl pk-yrs)    Types: Cigarettes  Start date: 2003   Smokeless tobacco: Never  Substance and Sexual Activity   Alcohol use: No   Drug use: Yes    Frequency: 1.0 times per week    Types: Marijuana    Comment: ocassionally   Sexual activity: Yes    Partners: Female    Birth control/protection: None  Other Topics Concern   Not on file  Social History Narrative   Not on file   Social Drivers of Health   Financial Resource Strain: Not on file  Food Insecurity: Not on file  Transportation Needs: Not on file  Physical Activity: Not on file  Stress: Not on file  Social Connections: Not on file    Objective:  BP (!) 148/88   Pulse 68   Temp 97.6 F (36.4 C)   Ht 5' 11 (1.803 m)   Wt 199 lb (90.3 kg)   SpO2 98%   BMI 27.75 kg/m       08/23/2023   10:33 AM 06/22/2023    3:41 PM 06/22/2023    2:47 PM  BP/Weight  Systolic BP 148 146 150  Diastolic BP 88 110 100  Wt. (Lbs) 199  202  BMI 27.75 kg/m2  28.17 kg/m2    Physical Exam Vitals and nursing note reviewed.  HENT:     Head: Normocephalic and atraumatic.  Cardiovascular:     Rate and Rhythm: Normal rate and regular rhythm.  Pulmonary:     Effort: Pulmonary effort is normal.     Breath sounds: Normal breath sounds.  Musculoskeletal:     Comments: Phalens positive bilaterally  Skin:    General: Skin is warm.  Neurological:     General: No focal deficit present.     Mental Status: He is alert.  Psychiatric:        Mood and Affect: Mood normal.         Lab Results  Component Value Date   WBC 9.9 06/22/2023   HGB 18.5 (H) 06/22/2023   HCT 54.5 (H) 06/22/2023   PLT 352 06/22/2023   GLUCOSE 95 06/22/2023   CHOL 191 06/22/2023   TRIG 205 (H) 06/22/2023   HDL 42 06/22/2023   LDLCALC 113 (H) 06/22/2023   ALT 55 (H) 06/22/2023   AST 23 06/22/2023   NA 140 06/22/2023   K 4.3 06/22/2023   CL 104 06/22/2023   CREATININE 1.24 06/22/2023   BUN 14 06/22/2023   CO2 21 06/22/2023      Assessment & Plan:  Benign essential HTN Assessment & Plan: Added Losartan  25 mg to the Amlodipine  which he already takes. Advised to cut back on sodium intake Advised to cut back on tobacco use. Monitor home BP readings and send me a log.    Bilateral carpal tunnel syndrome Assessment & Plan: States he had EMG studies done on his hands in the past, has a diagnosed carpal tunnel syndrome. Has not used braces but does stretching and exercises. Works a job which involves repetitive movements. Reports tingling and numbness during day and at nights. Wants to see a Hydrographic surveyor.  PLAN: 1. Referral made as per his request to hand surgery 2. Advised to use carpal tunnel braces 3. Avoid repetitive bending at the wrist   Orders: -     Ambulatory referral to Hand  Surgery  Viral wart on finger Assessment & Plan: Viral wart noted on right ring finger. Advised that he could make an appt to have the excess skin debrided and cryo.  Attention deficit hyperactivity disorder (ADHD), predominantly inattentive type Assessment & Plan: I do not feel comfortable initiating treatment with Adderall given his elevated BP readings and the dependence potential of the stimulant medication Adderall.  Advised that I could consider starting him on Concerta which would still be a stimulant for ADD but he declined. He does not want to try any other medication other than Adderall.  He does understand that I would not prescribe him Adderall.    Other orders -     Losartan  Potassium; Take 1 tablet (25 mg total) by mouth daily.  Dispense: 90 tablet; Refill: 0     Meds ordered this encounter  Medications   losartan  (COZAAR ) 25 MG tablet    Sig: Take 1 tablet (25 mg total) by mouth daily.    Dispense:  90 tablet    Refill:  0    Orders Placed This Encounter  Procedures   Ambulatory referral to Hand Surgery     Follow-up: No follow-ups on file.  An After Visit Summary was printed and given to the patient.  Kaida Games, MD [facility] [phone]

## 2023-08-23 NOTE — Assessment & Plan Note (Signed)
 Viral wart noted on right ring finger. Advised that he could make an appt to have the excess skin debrided and cryo.

## 2023-08-23 NOTE — Assessment & Plan Note (Signed)
 States he had EMG studies done on his hands in the past, has a diagnosed carpal tunnel syndrome. Has not used braces but does stretching and exercises. Works a job which involves repetitive movements. Reports tingling and numbness during day and at nights. Wants to see a Hydrographic surveyor.  PLAN: 1. Referral made as per his request to hand surgery 2. Advised to use carpal tunnel braces 3. Avoid repetitive bending at the wrist

## 2023-08-23 NOTE — Assessment & Plan Note (Signed)
 Added Losartan  25 mg to the Amlodipine  which he already takes. Advised to cut back on sodium intake Advised to cut back on tobacco use. Monitor home BP readings and send me a log.

## 2023-08-23 NOTE — Assessment & Plan Note (Signed)
 I do not feel comfortable initiating treatment with Adderall given his elevated BP readings and the dependence potential of the stimulant medication Adderall.  Advised that I could consider starting him on Concerta which would still be a stimulant for ADD but he declined. He does not want to try any other medication other than Adderall.  He does understand that I would not prescribe him Adderall.

## 2023-08-23 NOTE — Patient Instructions (Signed)
 Added LOSARTAN  25 MG to your Amlodipine  to help control BP Please cut back on salt intake and smoking I offered Concerta to help with your ADD but you refused. Unfortunately I do not prefer to prescribe Adderall. Placed a referral to hand surgeon for your worsening carpal tunnel pain Return for treatment of the viral wart on your right ring finger
